# Patient Record
Sex: Female | Born: 1970 | Race: Black or African American | Hispanic: No | Marital: Single
Health system: Midwestern US, Academic
[De-identification: ages and names within clinical notes are randomized; demographics above are authoritative.]

---

## 2016-09-22 MED ORDER — LACTATED RINGERS IV SOLP
1000 mL | INTRAVENOUS | 0 refills | Status: CP
Start: 2016-09-22 — End: ?

## 2016-09-22 MED ORDER — ACETAMINOPHEN 325 MG PO TAB
650 mg | Freq: Once | ORAL | 0 refills | Status: CP
Start: 2016-09-22 — End: ?

## 2019-06-30 ENCOUNTER — Emergency Department: Admit: 2019-06-30 | Discharge: 2019-07-01 | Payer: Private Health Insurance - Indemnity

## 2019-06-30 ENCOUNTER — Encounter: Admit: 2019-06-30 | Discharge: 2019-06-30 | Payer: Private Health Insurance - Indemnity

## 2019-06-30 ENCOUNTER — Emergency Department: Admit: 2019-06-30 | Discharge: 2019-06-30 | Payer: Private Health Insurance - Indemnity

## 2019-06-30 DIAGNOSIS — R079 Chest pain, unspecified: Secondary | ICD-10-CM

## 2019-06-30 DIAGNOSIS — Z9109 Other allergy status, other than to drugs and biological substances: Secondary | ICD-10-CM

## 2019-06-30 LAB — POC TROPONIN: Lab: 0 ng/mL (ref 0.00–0.05)

## 2019-06-30 LAB — CBC AND DIFF
Lab: 0 10*3/uL (ref 0–0.20)
Lab: 17 (ref <20.7)
Lab: 4.4 M/UL (ref 4.0–5.0)
Lab: 4.9 10*3/uL (ref 4.5–11.0)

## 2019-06-30 NOTE — ED Notes
Pt arrived to ED 14 with c/o CP, HTN. Pt reports around 1400 she started having L chest pain that radiated to L shoulder and down L arm. Pt took BP at home and it was reading high, pt called nurse and was told to come to ED. Pt reports pain is constant, rated 9.5/10, worse with movement. Pt is A&Ox4, breathing appears NL, pt resting on cart. Pt connected to monitor, call light within reach.    Belongings: cell phone, purse, debit card, shirt, pants, socks, shoes

## 2019-07-01 LAB — COMPREHENSIVE METABOLIC PANEL
Lab: 0.2 mg/dL — ABNORMAL LOW (ref 0.3–1.2)
Lab: 0.8 mg/dL (ref 0.4–1.00)
Lab: 10 mg/dL (ref 7–25)
Lab: 105 MMOL/L (ref 98–110)
Lab: 12 U/L (ref 7–56)
Lab: 140 MMOL/L (ref 137–147)
Lab: 17 U/L — ABNORMAL HIGH (ref 7–40)
Lab: 28 MMOL/L (ref 21–30)
Lab: 3.7 MMOL/L (ref 3.5–5.1)
Lab: 3.9 g/dL (ref 3.5–5.0)
Lab: 58 U/L (ref 25–110)
Lab: 7 10*3/uL (ref 3–12)
Lab: 8 g/dL (ref 6.0–8.0)
Lab: 9.6 mg/dL (ref 8.5–10.6)
Lab: 93 mg/dL (ref 70–100)
Lab: >60 mL/min (ref >60)
Lab: >60 mL/min — ABNORMAL HIGH (ref >60)

## 2019-07-01 LAB — POC TROPONIN: Lab: 0 ng/mL (ref 0.00–0.05)

## 2019-07-01 NOTE — ED Notes
This RN provides discharge teaching and instructions. Pt AAOx4, verbalizes understanding and denies further questions. Pt ambulates off unit with an even, steady gait in no acute distress. All belongings and paperwork in patient possession upon departure.

## 2019-07-07 ENCOUNTER — Ambulatory Visit: Admit: 2019-07-07 | Discharge: 2019-07-07 | Payer: Private Health Insurance - Indemnity

## 2019-07-07 ENCOUNTER — Encounter: Admit: 2019-07-07 | Discharge: 2019-07-07 | Payer: Private Health Insurance - Indemnity

## 2019-07-07 DIAGNOSIS — Z9109 Other allergy status, other than to drugs and biological substances: Secondary | ICD-10-CM

## 2019-07-07 DIAGNOSIS — I10 Essential (primary) hypertension: Secondary | ICD-10-CM

## 2019-07-07 DIAGNOSIS — R0789 Other chest pain: Secondary | ICD-10-CM

## 2019-07-07 NOTE — Patient Instructions
- It was nice to see you today!    - Please start checking your blood pressure daily and make of your readings. Bring your blood pressure machine with you on follow up. We would like to keep your blood pressure 130/80 or less.     - I have ordered an echocardiogram to evaluate for any changes in your heart related to high blood pressure.     - Please have a lipid panel drawn with any other labs requested by the family medicine team later this month.     - I have requested a 1 month follow up visit. Please send [place] a MyChart message or call our nurse line at [phone] with any questions or concerns in the meantime.       Controlling High Blood Pressure  High blood pressure (hypertension) is often called the silent killer. This is because many people who have it, don?t know it. It can be very dangerous. High blood pressure can raise your risk of heart attack, stroke, heart disease, and heart failure. Controlling your blood pressure can decrease your risk of these problems. It's important to know the appropriate blood pressure range and remember to check your blood pressure regularly. Doing so can save your life.  Blood pressure measurements are given as 2 numbers. Systolic blood pressure is the upper number. This is the pressure when the heart contracts. Diastolic blood pressure is the lower number. This is the pressure when the heart relaxes between beats.  Blood pressure is categorized as normal, elevated, or stage 1 or stage 2 high blood pressure:  ? Normal blood pressure is systolic of less than 120 and diastolic of less than 80 (120/80)  ? Elevated blood pressure is systolic of 120 to 129 and diastolic less than 80  ? Stage 1 high blood pressure is systolic of 130 to 139 or diastolic between 80 to 89  ? Stage 2 high blood pressure is when systolic is 140 or higher or the diastolic is 90 or higher A heart-healthy lifestyle can help you control your blood pressure without medicines. Here are some things you can do to pursue a heart-healthy lifestyle:    Choose heart-healthy foods  ? Select low-salt, low-fat foods. Limit sodium intake to 2,400 mg per day or the amount suggested by your healthcare provider.  ? Limit canned, dried, cured, packaged, and fast foods. These can contain a lot of salt.  ? Eat 8 to 10 servings of fruits and vegetables every day.  ? Choose lean meats, fish, or chicken.  ? Eat whole-grain pasta, brown rice, and beans.  ? Eat 2 to 3 servings of low-fat or fat-free dairy products.  ? Ask your doctor about the DASH eating plan. This plan helps reduce blood pressure.  ? When you go to a restaurant, ask that your meal be prepared with no added salt.    Stay at a healthy weight  ? Ask your healthcare provider how many calories to eat a day. Then stick to that number.  ? Ask your healthcare provider what weight range is healthiest for you. If you are overweight, a weight loss of only 3% to 5% of your body weight?can help lower blood pressure. Generally, a good weight loss goal is to lose 10% of your body weight in a year.  ? Limit snacks and sweets.  ? Get regular exercise.    Get up and get active  ? Find activities you enjoy that can be done alone or with  friends or family. Such activities might include bicycling, dancing, walking, or jogging.  ? Park farther away from building entrances to walk more.  ? Use stairs instead of the elevator.  ? When you can, walk or bike instead of driving.  ? Rake leaves, garden, or do household repairs.  ? Be active at a moderate to vigorous level of physical activity for at least 40 minutes for a minimum of 3 to 4 days a week.?    Manage stress  ? Make time to relax and enjoy life. Find time to laugh.  ? Communicate your concerns with your loved ones and your healthcare provider.  ? Visit with family and friends, and keep up with hobbies. Limit alcohol and quit smoking  ? Men should have no more than 2 drinks per day.  ? Women should have no more than 1 drink per day.  ? Talk with your healthcare provider about quitting smoking. Smoking significantly increases your risk for heart disease and stroke. Ask your healthcare provider about community smoking cessation programs and other options.    Medicines  If lifestyle changes aren?t enough, your healthcare provider may prescribe high blood pressure medicine. Take all medicines as prescribed. If you have any questions about your medicines, ask your healthcare provider before stopping or changing them.  StayWell last reviewed this educational content on 11/03/2017  ? 2000-2020 [employer], [place]. All rights reserved. This information is not intended as a substitute for professional medical care. Always follow your healthcare professional's instructions.

## 2019-07-08 ENCOUNTER — Encounter: Admit: 2019-07-08 | Discharge: 2019-07-08 | Payer: Private Health Insurance - Indemnity

## 2019-07-08 DIAGNOSIS — Z9109 Other allergy status, other than to drugs and biological substances: Secondary | ICD-10-CM

## 2019-07-11 ENCOUNTER — Encounter: Admit: 2019-07-11 | Discharge: 2019-07-11 | Payer: Private Health Insurance - Indemnity

## 2019-07-11 NOTE — Telephone Encounter
Pt. requesting a later appointment time on the date she is scheduled. LVM [id number]

## 2019-07-11 NOTE — Telephone Encounter
Returned pts call to assist with scheduling needs no answer left vm. First attempt

## 2019-07-15 ENCOUNTER — Ambulatory Visit: Admit: 2019-07-15 | Discharge: 2019-07-15 | Payer: Private Health Insurance - Indemnity

## 2019-07-15 ENCOUNTER — Encounter: Admit: 2019-07-15 | Discharge: 2019-07-15 | Payer: Private Health Insurance - Indemnity

## 2019-07-15 DIAGNOSIS — I10 Essential (primary) hypertension: Secondary | ICD-10-CM

## 2019-07-15 DIAGNOSIS — R0789 Other chest pain: Secondary | ICD-10-CM

## 2019-08-05 ENCOUNTER — Encounter: Admit: 2019-08-05 | Discharge: 2019-08-05 | Payer: Private Health Insurance - Indemnity

## 2019-08-14 ENCOUNTER — Encounter
Admit: 2019-08-14 | Discharge: 2019-08-14 | Payer: Private Health Insurance - Indemnity | Primary: Student in an Organized Health Care Education/Training Program

## 2019-08-14 ENCOUNTER — Ambulatory Visit
Admit: 2019-08-14 | Discharge: 2019-08-15 | Payer: Private Health Insurance - Indemnity | Primary: Student in an Organized Health Care Education/Training Program

## 2019-08-14 MED ORDER — LISINOPRIL 10 MG PO TAB
10 mg | ORAL_TABLET | Freq: Every day | ORAL | 3 refills | Status: DC
Start: 2019-08-14 — End: 2019-08-28

## 2019-08-14 NOTE — Patient Instructions
Low-Salt Diet  This diet removes foods that are high in salt. It also limits the amount of salt you use when cooking. It is most often used for people with high blood pressure, fluid retention (edema), and kidney, liver, or heart disease.   Table salt has the mineral sodium. Your body needs sodium to work normally. But too much sodium can make your health problems worse. Your healthcare provider advises a low-salt (low-sodium) diet for you. Your total daily allowed amount of salt is 1,500 to 2,300 milligrams (mg). This is less than 1 teaspoon of table salt. This means you can have only about 500 to 700 mg of sodium at each meal. People with certain health problems should limit salt intake to the lower end of the advised range.     When you cook, don’t add much salt. If you can cook without using salt, even better. Don’t add salt to your food at the table.   When shopping, read food labels. Salt is often called sodium on the label. Choose foods that are salt-free, low salt, or very low salt. Note that foods with reduced salt may not lower your salt intake enough.     Beans, potatoes, and pasta  OK: Dry beans, split peas, lentils, potatoes, rice, macaroni, pasta, spaghetti with no added salt, canned beans with no added salt   Avoid: Salted potato chips; regular (salt added) canned beans   Breads and grains  OK: Low-sodium breads, rolls, cereals, and cakes; low-salt crackers, matzo crackers   Avoid: Salted crackers, pretzels, tortilla chips, popcorn, and other salty snacks; French toast, pancakes, muffins, regular bread   Dairy  OK: Milk, chocolate milk, hot chocolate mix, low-salt cheeses, yogurt   Avoid: Processed cheese and cheese spreads; Roquefort, Camembert, and cottage cheese; buttermilk, instant breakfast drink   Desserts  OK: Ice cream, frozen yogurt, juice bars, gelatin, cookies and pies, sugar, honey, jelly, hard candy   Avoid: Most pies, cakes and cookies made with salt; instant pudding   Drinks  OK: Tea,  coffee, fizzy (carbonated) drinks, juices   Avoid: Flavored coffees, electrolyte replacement drinks, sports drinks   Meats  OK: All fresh meat, fish, poultry, low-salt tuna, eggs, egg substitute   Avoid: Smoked, pickled, brine-cured, or salted meats and fish, and processed poultry injected with salt or marinade. This includes bacon, chipped beef, corned beef, hot dogs, deli meats, ham, kosher meats, salt pork, sausage, canned tuna, salted codfish, smoked salmon, herring, sardines, and anchovies.   Seasonings  OK: Most seasonings are okay. Good substitutes for salt include: fresh herb blends, hot sauce, lemon, garlic, curry, vinegar, dry mustard, parsley, cilantro, horseradish, tomato paste, margarine, mayonnaise, unsalted butter, cream cheese, vegetable and olive oil, cream, low-salt salad dressing and gravy.   Avoid: Regular ketchup, relishes, pickles, soy sauce, teriyaki sauce, Worcestershire sauce, BBQ sauce, tartar sauce, meat tenderizer, chili sauce, regular gravy, regular salad dressing, salted butter   Soups  OK: Low-salt soups and broths made with allowed foods   Avoid: Bouillon cubes, soups with smoked or salted meats, regular soup and broth   Vegetables  OK: Most vegetables are okay, including canned vegetables with no salt added, and plain frozen vegetables; low-salt tomato and vegetable juices   Avoid: Sauerkraut and other brined vegetables; pickles and pickled vegetables; tomato juice, olives, canned vegetables with salt, frozen vegetables with sauces   StayWell last reviewed this educational content on 07/06/2018  © 2000-2020 [employer]. All rights reserved. This information is not intended as a substitute for professional medical care. Always follow   your healthcare professional's instructions.

## 2019-08-27 ENCOUNTER — Encounter
Admit: 2019-08-27 | Discharge: 2019-08-27 | Payer: Private Health Insurance - Indemnity | Primary: Student in an Organized Health Care Education/Training Program

## 2019-08-27 ENCOUNTER — Ambulatory Visit
Admit: 2019-08-27 | Discharge: 2019-08-27 | Payer: Private Health Insurance - Indemnity | Primary: Student in an Organized Health Care Education/Training Program

## 2019-08-27 DIAGNOSIS — Z1239 Encounter for other screening for malignant neoplasm of breast: Secondary | ICD-10-CM

## 2019-08-27 NOTE — Progress Notes
Progress Note - Outpatient    Encounter Date: 08/28/2019   Patient Name: Amy Webb   Date of Birth: 02-Mar-1971   MRN: [id number]   Encounter Provider Jake Bathe, MD   Primary Care Physician:  Madelyn Flavors, MD (General)       Subjective     Chief Complaint: annual physical and blood pressure follow up  Chief Complaint   Patient presents with   ? Physical     History of Present Illness  Amy Webb is a 50 y.o. female with PMH hypertension, gestational diabetes, LSIL who presents for annual physical and blood pressure follow up.    HTN: patient reports that she takes her blood pressure at home and it has been between 136-137 systolic and 93-97 diastolic. Patient is very hesitant to increase the lisinopril because she doesn't want to have more medication in her body.     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 Drank wine on birthday.   - Tobacco: never  - Illicit drugs: none  - Safe at home: yes    Reproductive health:  - Last Pap smear: 2019. Per patient, normal. LSIL and HPV positive.   - LMP: 08/11/19  - Periods are regular  - Sexual activity: yes  - Partners are female, female, or both: female  - Condom use: every time  - Contraception: tubal ligation  - GP: G2P2  - Hx of STIs: none    Family history:  - Breast cancer: mother at age 70  - Colon cancer: mother had polyps  - Premature CAD: none    Screening:  - Mammogram: completed yesterday  - Colonoscopy: 2013, normal    Mood:  - PHQ-2: 0    Labs:  - Lipid panel: due  - CMP: 06/30/19  - One-time HIV screening: completed  - One-time HCV screening: completed  - Chlamydia, gonorrhea: screening     Medications:  - Statin (LDL >190, DM 40-75 LDL 70+, ASCVD >7.5%): will get lipid panel    Immunizations:  - Tdap: due in 2024  - PPSV23: not indicated    Review of Systems   Constitutional: Negative for chills and fever.   HENT: Negative for hearing loss. Eyes: Negative for blurred vision.   Respiratory: Negative for shortness of breath.    Cardiovascular: Negative for chest pain.   Gastrointestinal: Negative for constipation and diarrhea.   Genitourinary: Negative for dysuria.   Skin: Negative for rash.   Psychiatric/Behavioral: Negative for depression.     Past Medical History:   Medical History:   Diagnosis Date   ? Allergy    ? Environmental allergies        Surgical History:  Surgical History:   Procedure Laterality Date   ? HX HAND SURGERY  2005    Right hand   ? FRACTURE SURGERY     ? HX TUBAL LIGATION         Social History:   Social History     Socioeconomic History   ? Marital status: Single     Spouse name: Not given   ? Number of children: 1   ? Years of education: Not on file   ? Highest education level: Not on file   Occupational History   ? Occupation: Chief Financial Officer: [employer]   Tobacco Use   ? Smoking status: Never  Smoker   ? Smokeless tobacco: Never Used   Substance and Sexual Activity   ? Alcohol use: Not Currently     Alcohol/week: 0.0 standard drinks   ? Drug use: Never   ? Sexual activity: Yes     Partners: Male     Birth control/protection: Surgical   Other Topics Concern   ? Not on file   Social History Narrative   ? Not on file       Family History:   Family History   Problem Relation Age of Onset   ? Cancer-Breast Mother    ? Diabetes Mother    ? Hypertension Mother    ? Arthritis Mother    ? Asthma Mother    ? Hearing Loss Mother    ? Hypertension Father    ? Arthritis Father    ? Other Brother         Parkinson's   ? Depression Brother         Pt stated he's suicidal; receiving help. Pt unsure of ailment, though.   ? Drug Abuse Brother    ? Mental Illness Brother    ? Cervical Cancer Neg Hx    ? Cancer-Colon Neg Hx    ? Cancer-Ovarian Neg Hx    ? Cancer-Uterine Neg Hx    ? Anesthetic Complication Neg Hx    ? Bleeding Disorders Neg Hx    ? Heart Disease Neg Hx          Objective     Medications  ? lisinopriL (ZESTRIL) 20 mg tablet Take one tablet by mouth daily.   ? mv,calcium,min/iron/folic/vitK (ONE-A-DAY WOMEN'S COMPLETE PO) Take  by mouth.       Allergies:   Allergies   Allergen Reactions   ? Aspirin HIVES   ? Pcn [Penicillins] HIVES   ? Sudafed [Pseudoephedrine Hcl] HIVES and DIZZINESS       Vitals  Vitals:    08/28/19 1701 08/28/19 1703   BP: (!) 150/96 (!) 141/98   Pulse: 79    Resp: 14    Temp: 37 ?C (98.6 ?F)    TempSrc: Skin    Weight: 89.4 kg (197 lb)    Height: 172.7 cm (67.99)    PainSc: Zero        Body mass index is 29.96 kg/m?Marland Kitchen      Physical Exam   Constitutional: She is well-developed, well-nourished, and in no distress.   HENT:   Head: Normocephalic and atraumatic.   Eyes: Conjunctivae are normal. No scleral icterus.   Neck: Neck supple.   Cardiovascular: Normal rate and regular rhythm.   Pulmonary/Chest: Effort normal. She has no wheezes. She has no rales.   Abdominal: Soft. There is no abdominal tenderness. There is no rebound.   Genitourinary:    Genitourinary Comments: Moderate thin white discharge from cervix. No cervical motion tenderness. No adnexal masses.     Musculoskeletal:         General: No edema.   Lymphadenopathy:     She has no cervical adenopathy.   Neurological: She is alert.   Answering questions appropriately.   Skin: Skin is warm and dry.   Psychiatric:   Cooperative. Appropriate affect.         Assessment/Plan     Amy Webb was seen today for physical.    Diagnoses and all orders for this visit:    Annual physical exam  -     Amy Webb is a 49 year old female  with PMH of HTN, gestational diabetes, and previous LSIL on Pap  -     Performed Pap today. Moderate white thin cervical discharge. Will order gonorrhea, chlamydia, and trichomonas testing  -     Completed mammogram yesterday  -     Patient has BMI of 30. Will order lipid profile and HbA1c  -     LIPID PROFILE; Future; Expected date: 08/28/2019  -     CYTOLOGY PAP SMEAR SCREENING  -     TRICHOMONAS VAGINALIS THIN PREP; Future; Expected date: 08/28/2019  -     CHLAM/NG PCR THIN PREP; Future; Expected date: 08/28/2019  -     HEMOGLOBIN A1C; Future; Expected date: 08/28/2019    Essential hypertension  -     Blood pressure not well controlled with systolic >140 and diastolic >90. Will increase lisinopril to 20mg  daily.  -     Recommended patient record blood pressure values daily at home to bring to next appointment. Will reassess in 4 weeks.   -     lisinopriL (ZESTRIL) 20 mg tablet; Take one tablet by mouth daily.    Disposition: Return in about 4 weeks (around 09/25/2019) for Hypertension Follow-up.    Patient seen by and discussed with attending physician, Dr. Katrinka Blazing.    Jake Bathe, MD  [facility]  Department of Family Medicine, PGY-1

## 2019-08-28 ENCOUNTER — Encounter
Admit: 2019-08-28 | Discharge: 2019-08-28 | Payer: Private Health Insurance - Indemnity | Primary: Student in an Organized Health Care Education/Training Program

## 2019-08-28 ENCOUNTER — Ambulatory Visit
Admit: 2019-08-28 | Discharge: 2019-08-28 | Payer: Private Health Insurance - Indemnity | Primary: Student in an Organized Health Care Education/Training Program

## 2019-08-28 DIAGNOSIS — I10 Essential (primary) hypertension: Secondary | ICD-10-CM

## 2019-08-28 DIAGNOSIS — Z0000 Encounter for general adult medical examination without abnormal findings: Secondary | ICD-10-CM

## 2019-08-28 DIAGNOSIS — Z9109 Other allergy status, other than to drugs and biological substances: Secondary | ICD-10-CM

## 2019-08-28 DIAGNOSIS — T7840XA Allergy, unspecified, initial encounter: Secondary | ICD-10-CM

## 2019-08-28 MED ORDER — LISINOPRIL 20 MG PO TAB
20 mg | ORAL_TABLET | Freq: Every day | ORAL | 0 refills | Status: DC
Start: 2019-08-28 — End: 2019-10-20

## 2019-08-29 ENCOUNTER — Encounter
Admit: 2019-08-29 | Discharge: 2019-08-29 | Payer: Private Health Insurance - Indemnity | Primary: Student in an Organized Health Care Education/Training Program

## 2019-08-29 NOTE — Patient Instructions
Established High Blood Pressure    High blood pressure (hypertension) is a long-term (chronic) disease. Often healthcare providers don’t know what causes it. But it can be caused by certain health conditions and medicines.  If you have high blood pressure, you may not have any symptoms. If you do have symptoms, they may include:  · Headache  · Dizziness  · Changes in your vision  · Chest pain  · Shortness of breath  But even without symptoms, high blood pressure that’s not treated raises your risk for heart attack, heart failure, kidney disease, and stroke. High blood pressure is a serious health risk and shouldn’t be ignored.  Blood pressure measurements are given as 2 numbers. Systolic blood pressure is the upper number. This is the pressure when the heart contracts. Diastolic blood pressure is the lower number. This is the pressure when the heart relaxes between beats. You will see your blood pressure readings written together. For example, a person with a systolic pressure of 118 and a diastolic pressure of 78 will have 118/78 written in the medical record.  Blood pressure is classified as normal, raised (elevated) or stage 1 or stage 2 high blood pressure:  · Normal blood pressure. Systolic of less than 120 and diastolic of less than 80 (120/80).  · Elevated blood pressure. Systolic of 120 to 129 and diastolic less than 80.  · Stage 1 high blood pressure. Systolic is 130 to 139 or diastolic between 80 to 89.  · Stage 2 high blood pressure. Systolic is 140 or higher or the diastolic is 90 or higher.  Home care  If you have high blood pressure, follow these home care guidelines to help lower your blood pressure. If you are taking medicines for high blood pressure, these methods may reduce or end your need for medicines in the future.  · Start a weight-loss program if you are overweight.  · Cut back on how much salt you get in your diet. Here’s how to do this:  ? Don’t eat foods that have a lot of salt. These  include olives, pickles, smoked meats, and salted potato chips.  ? Don’t add salt to your food at the table.  ? Use only small amounts of salt when cooking.  · Start an exercise program. Talk with your healthcare provider about the type of exercise program that would be best for you. It doesn't have to be hard. Even brisk walking for 20 minutes 3 times a week is a good form of exercise.  · Don’t take medicines that stimulate the heart. This includes many over-the-counter cold and sinus decongestant pills and sprays, as well as diet pills. Check the warnings about high blood pressure on the label. Before buying any over-the-counter medicines or supplements, always ask the pharmacist about the product's possible interaction with your high blood pressure and your high blood pressure medicines.  · Stimulants such as amphetamine or cocaine could be deadly for someone with high blood pressure. Never take these.  · Limit how much caffeine you get in your diet. Switch to caffeine-free products.  · Stop smoking. If you are a long-time smoker, this can be hard. Talk with your healthcare provider about medicines and nicotine replacement options to help you. Also join a stop-smoking program . This makes it more likely that you will quit for good.  · Learn how to handle stress. This is an important part of any program to lower blood pressure. Learn about relaxation methods such as meditation,   yoga, or biofeedback.  · If your provider prescribed medicines, take them exactly as directed. Missing doses may cause your blood pressure get out of control.  · If you miss a dose, check with your healthcare provider or pharmacist about what to do.  · Think about buying an automatic blood pressure machine to check your blood pressure at home. Ask your provider for a recommendation. You can get one of these at most pharmacies.  Using a home blood pressure monitor  The American Heart Association advises the following guidelines for home  blood pressure monitoring:  · Don't smoke or drink coffee for 30 minutes before taking your blood pressure.  · Go to the bathroom before the test.  · Relax for 5 minutes before taking the measurement.  · Sit with your back supported (don't sit on a couch or soft chair). Keep your feet on the floor uncrossed. Place your arm on a solid flat surface (such as a table) with the upper part of the arm at heart level. Place the middle of the cuff directly above the bend of the elbow. Check the monitor's instruction manual for an illustration.  · Take multiple readings. When you measure, take 2 to 3 readings one minute apart and record all of the results.  · Take your blood pressure at the same time every day, or as your healthcare provider advises.  · Record the date, time, and blood pressure reading.  · Take the record with you to your next healthcare appointment. If your blood pressure monitor has a built-in memory, just take the monitor with you to your next appointment.  · Call your provider if you have several high readings. Don't be frightened by one high blood pressure reading. But if you get a few high readings, check in with your healthcare provider.  Follow-up care  You will need to see your healthcare provider regularly. This is to check your blood pressure and to make changes to your medicines. Make a follow-up appointment as directed. Bring the record of your home blood pressure readings to the appointment.  Call 911  Call 911 if you have any of these:  · Blood pressure of 180/120 or higher  · Chest pain or shortness of breath  · Weakness of an arm or leg or one side of the face  · Problems speaking or seeing     When to get medical advice  Call your healthcare provider right away if any of these occur:  · Severe headache  · Throbbing or rushing sound in the ears  · Nosebleed  · Sudden severe pain in your belly (abdomen)  · Extreme drowsiness, confusion, or fainting  · Dizziness or spinning feeling  (vertigo)  StayWell last reviewed this educational content on 12/03/2017  © 2000-2020 [employer]. All rights reserved. This information is not intended as a substitute for professional medical care. Always follow your healthcare professional's instructions.

## 2019-09-12 ENCOUNTER — Encounter
Admit: 2019-09-12 | Discharge: 2019-09-12 | Payer: Private Health Insurance - Indemnity | Primary: Student in an Organized Health Care Education/Training Program

## 2019-09-12 NOTE — Telephone Encounter
Labs already ordered at previous OV. Will sent pt message now.

## 2019-09-12 NOTE — Telephone Encounter
Pt. requesting her HBA1c be ordered as she is over due. LVM 1001

## 2019-10-06 ENCOUNTER — Ambulatory Visit
Admit: 2019-10-06 | Discharge: 2019-10-06 | Payer: Private Health Insurance - Indemnity | Primary: Student in an Organized Health Care Education/Training Program

## 2019-10-06 ENCOUNTER — Encounter
Admit: 2019-10-06 | Discharge: 2019-10-06 | Payer: Private Health Insurance - Indemnity | Primary: Student in an Organized Health Care Education/Training Program

## 2019-10-06 DIAGNOSIS — E6609 Other obesity due to excess calories: Secondary | ICD-10-CM

## 2019-10-06 DIAGNOSIS — R0789 Other chest pain: Secondary | ICD-10-CM

## 2019-10-06 DIAGNOSIS — Z9109 Other allergy status, other than to drugs and biological substances: Secondary | ICD-10-CM

## 2019-10-06 DIAGNOSIS — I10 Essential (primary) hypertension: Secondary | ICD-10-CM

## 2019-10-06 DIAGNOSIS — Z0000 Encounter for general adult medical examination without abnormal findings: Secondary | ICD-10-CM

## 2019-10-06 DIAGNOSIS — T7840XA Allergy, unspecified, initial encounter: Secondary | ICD-10-CM

## 2019-10-06 LAB — LIPID PROFILE
Lab: 161 mg/dL — ABNORMAL HIGH (ref <100)
Lab: 185 mg/dL
Lab: 218 mg/dL — ABNORMAL HIGH (ref <200)
Lab: 225 mg/dL — ABNORMAL HIGH (ref <150)
Lab: 33 mg/dL — ABNORMAL LOW (ref >40)
Lab: 45 mg/dL

## 2019-10-06 LAB — BASIC METABOLIC PANEL
Lab: 0.7 mg/dL (ref 0.4–1.00)
Lab: 10 mg/dL (ref 7–25)
Lab: 100 mg/dL (ref 70–100)
Lab: 105 MMOL/L (ref 98–110)
Lab: 139 MMOL/L (ref 137–147)
Lab: 27 MMOL/L (ref 21–30)
Lab: 3.7 MMOL/L (ref 3.5–5.1)
Lab: 9.1 mg/dL (ref 8.5–10.6)
Lab: >60 mL/min (ref >60)
Lab: >60 mL/min (ref >60)
Lab: 7 (ref 3–12)

## 2019-10-06 NOTE — Progress Notes
Subjective: Amy Webb is a 49 y.o. female presenting for HTN    History of Present Illness:    HTN  - previously elevated BP on Lisinopril 10mg   - Increased to 20mg  on 3/25  - 133/86 today  - tolerating dose increase without side effects    Review of Systems:  Remainder of review of systems negative except for as mentioned in HPI.     Active Ambulatory Problems     Diagnosis Date Noted   ? HTN (hypertension) 04/08/2014   ? History of abnormal cervical Pap smear 04/08/2014   ? Other chest pain 07/07/2019     Resolved Ambulatory Problems     Diagnosis Date Noted   ? Supervision of high-risk pregnancy 10/02/2012   ? Gestational hypertension 01/15/2013   ? GDM (gestational diabetes mellitus), class A1 01/20/2013   ? AMA (advanced maternal age) multigravida 35+ 01/20/2013   ? Pregnancy 03/23/2013     Past Medical History:   Diagnosis Date   ? Allergy    ? Environmental allergies        Surgical History:   Procedure Laterality Date   ? HX HAND SURGERY  2005    Right hand   ? FRACTURE SURGERY     ? HX TUBAL LIGATION         Current Outpatient Medications on File Prior to Visit   Medication Sig Dispense Refill   ? lisinopriL (ZESTRIL) 20 mg tablet Take one tablet by mouth daily. 30 tablet 0   ? mv,calcium,min/iron/folic/vitK (ONE-A-DAY WOMEN'S COMPLETE PO) Take  by mouth.       No current facility-administered medications on file prior to visit.        Social History     Socioeconomic History   ? Marital status: Single     Spouse name: Not given   ? Number of children: 1   ? Years of education: Not on file   ? Highest education level: Not on file   Occupational History   ? Occupation: Chief Financial Officer: [employer]   Tobacco Use   ? Smoking status: Never Smoker   ? Smokeless tobacco: Never Used   Substance and Sexual Activity   ? Alcohol use: Not Currently     Alcohol/week: 0.0 standard drinks   ? Drug use: Never   ? Sexual activity: Yes     Partners: Male     Birth control/protection: Surgical   Other Topics Concern   ? Not on file   Social History Narrative   ? Not on file       Family History   Problem Relation Age of Onset   ? Cancer-Breast Mother    ? Diabetes Mother    ? Hypertension Mother    ? Arthritis Mother    ? Asthma Mother    ? Hearing Loss Mother    ? Hypertension Father    ? Arthritis Father    ? Other Brother         Parkinson's   ? Depression Brother         Pt stated he's suicidal; receiving help. Pt unsure of ailment, though.   ? Drug Abuse Brother    ? Mental Illness Brother    ? Cervical Cancer Neg Hx    ? Cancer-Colon Neg Hx    ? Cancer-Ovarian Neg Hx    ? Cancer-Uterine Neg Hx    ? Anesthetic Complication Neg Hx    ? Bleeding Disorders  Neg Hx    ? Heart Disease Neg Hx        Objective:    Vitals:    10/06/19 1359   BP: 133/86   Pulse: 74   Resp: 16   Temp: 36.3 ?C (97.4 ?F)   TempSrc: Skin   Weight: 88 kg (194 lb)   Height: 172.7 cm (68)   PainSc: Zero       Body mass index is 29.5 kg/m?Marland Kitchen    General: No acute distress, non-labored breathing. AAOx3. Afebrile  Head: Normocephalic and atraumatic.    Eyes: Pupils are equal. No scleral icterus.    Cardiovascular: Regular Rate, No edema, regular rhythm, no murmur  Pulmonary/Chest: Breathing is non labored, not in respiratory distress. Lungs clear to auscultation bilaterally  Abdominal: nondistended  Musculoskeletal: No deformity.    Skin: No rash or wound on exposed areas   Neuro: Grossly intact.  Psychiatric: Normal mood and affect.   Nursing note and vitals reviewed.      Assessment/Plan:  Amy Webb was seen today for hypertension.    Diagnoses and all orders for this visit:    Essential hypertension  -     BP at goal with current dose of lisinopril and dose tolerated well. Will continue as prescribed. Needs BMP.     Return precautions discussed, questions sought and answered. Patient expressed understanding and agreed with treatment plan.     Patient staffed with Dr. Derald Macleod T. Primitivo Gauze, M.D.  Resident Physician, PGY-3  Department of Family Medicine

## 2019-10-19 ENCOUNTER — Encounter
Admit: 2019-10-19 | Discharge: 2019-10-19 | Payer: Private Health Insurance - Indemnity | Primary: Student in an Organized Health Care Education/Training Program

## 2019-10-19 DIAGNOSIS — I10 Essential (primary) hypertension: Secondary | ICD-10-CM

## 2019-10-20 MED ORDER — LISINOPRIL 20 MG PO TAB
ORAL_TABLET | Freq: Every day | 1 refills | Status: DC
Start: 2019-10-20 — End: 2019-12-10

## 2019-12-09 ENCOUNTER — Encounter
Admit: 2019-12-09 | Discharge: 2019-12-09 | Payer: Private Health Insurance - Indemnity | Primary: Student in an Organized Health Care Education/Training Program

## 2019-12-09 DIAGNOSIS — I10 Essential (primary) hypertension: Secondary | ICD-10-CM

## 2019-12-09 MED ORDER — LISINOPRIL 20 MG PO TAB
20 mg | ORAL_TABLET | Freq: Every day | ORAL | 1 refills | Status: AC
Start: 2019-12-09 — End: ?

## 2019-12-25 ENCOUNTER — Encounter
Admit: 2019-12-25 | Discharge: 2019-12-25 | Payer: Private Health Insurance - Indemnity | Primary: Student in an Organized Health Care Education/Training Program

## 2020-08-04 ENCOUNTER — Encounter
Admit: 2020-08-04 | Discharge: 2020-08-04 | Payer: Private Health Insurance - Indemnity | Primary: Student in an Organized Health Care Education/Training Program

## 2020-08-04 DIAGNOSIS — Z1231 Encounter for screening mammogram for malignant neoplasm of breast: Secondary | ICD-10-CM

## 2020-08-16 ENCOUNTER — Encounter
Admit: 2020-08-16 | Discharge: 2020-08-16 | Payer: Private Health Insurance - Indemnity | Primary: Student in an Organized Health Care Education/Training Program

## 2020-08-17 ENCOUNTER — Encounter
Admit: 2020-08-17 | Discharge: 2020-08-17 | Payer: Private Health Insurance - Indemnity | Primary: Student in an Organized Health Care Education/Training Program

## 2020-08-17 ENCOUNTER — Ambulatory Visit
Admit: 2020-08-17 | Discharge: 2020-08-17 | Payer: Private Health Insurance - Indemnity | Primary: Student in an Organized Health Care Education/Training Program

## 2020-08-17 DIAGNOSIS — Z1231 Encounter for screening mammogram for malignant neoplasm of breast: Secondary | ICD-10-CM

## 2020-08-20 ENCOUNTER — Encounter
Admit: 2020-08-20 | Discharge: 2020-08-20 | Payer: Private Health Insurance - Indemnity | Primary: Student in an Organized Health Care Education/Training Program

## 2020-08-20 DIAGNOSIS — I10 Essential (primary) hypertension: Secondary | ICD-10-CM

## 2020-08-20 MED ORDER — LISINOPRIL 20 MG PO TAB
20 mg | ORAL_TABLET | Freq: Every day | ORAL | 0 refills | Status: AC
Start: 2020-08-20 — End: ?

## 2020-09-14 ENCOUNTER — Encounter
Admit: 2020-09-14 | Discharge: 2020-09-14 | Payer: Private Health Insurance - Indemnity | Primary: Student in an Organized Health Care Education/Training Program

## 2020-09-21 ENCOUNTER — Encounter
Admit: 2020-09-21 | Discharge: 2020-09-21 | Payer: Private Health Insurance - Indemnity | Primary: Student in an Organized Health Care Education/Training Program

## 2020-09-21 ENCOUNTER — Ambulatory Visit
Admit: 2020-09-21 | Discharge: 2020-09-21 | Payer: Private Health Insurance - Indemnity | Primary: Student in an Organized Health Care Education/Training Program

## 2020-09-21 DIAGNOSIS — I10 Essential (primary) hypertension: Secondary | ICD-10-CM

## 2020-09-21 DIAGNOSIS — Z1211 Encounter for screening for malignant neoplasm of colon: Secondary | ICD-10-CM

## 2020-09-21 DIAGNOSIS — Z9109 Other allergy status, other than to drugs and biological substances: Secondary | ICD-10-CM

## 2020-09-21 DIAGNOSIS — T7840XA Allergy, unspecified, initial encounter: Secondary | ICD-10-CM

## 2020-09-21 LAB — LIPID PROFILE
Lab: 154 mg/dL — ABNORMAL HIGH (ref <150)
Lab: 162 mg/dL — ABNORMAL HIGH (ref <100)
Lab: 179 mg/dL (ref 0.4–1.00)
Lab: 214 mg/dL — ABNORMAL HIGH (ref <200)
Lab: 31 mg/dL (ref 7–25)
Lab: 35 mg/dL — ABNORMAL LOW (ref >40)

## 2020-09-21 LAB — BASIC METABOLIC PANEL
Lab: 141 MMOL/L (ref 137–147)
Lab: 4.1 MMOL/L (ref 3.5–5.1)
Lab: 9.1 mg/dL (ref 8.5–10.6)
Lab: >60 mL/min (ref >60)

## 2020-09-21 MED ORDER — LISINOPRIL 20 MG PO TAB
20 mg | ORAL_TABLET | Freq: Every day | ORAL | 3 refills | Status: AC
Start: 2020-09-21 — End: ?

## 2020-09-21 NOTE — Patient Instructions
Low-Salt Diet  This diet removes foods that are high in salt. It also limits the amount of salt you use when cooking. It is most often used for people with high blood pressure, fluid retention (edema), and kidney, liver, or heart disease.   Table salt has the mineral sodium. Your body needs sodium to work normally. But too much sodium can make your health problems worse. Your healthcare provider advises a low-salt (low-sodium) diet for you. Your total daily allowed amount of salt is 1,500 to 2,300 milligrams (mg). This is less than 1 teaspoon of table salt. This means you can have only about 500 to 700 mg of sodium at each meal. People with certain health problems should limit salt intake to the lower end of the advised range.     When you cook, don’t add much salt. If you can cook without using salt, even better. Don’t add salt to your food at the table.   When shopping, read food labels. Salt is often called sodium on the label. Choose foods that are salt-free, low salt, or very low salt. Note that foods with reduced salt may not lower your salt intake enough.     Beans, potatoes, and pasta  OK: Dry beans, split peas, lentils, potatoes, rice, macaroni, pasta, spaghetti with no added salt, canned beans with no added salt   Avoid: Salted potato chips; regular (salt added) canned beans   Breads and grains  OK: Low-sodium breads, rolls, cereals, and cakes; low-salt crackers, matzo crackers   Avoid: Salted crackers, pretzels, tortilla chips, popcorn, and other salty snacks; French toast, pancakes, muffins, regular bread   Dairy  OK: Milk, chocolate milk, hot chocolate mix, low-salt cheeses, yogurt   Avoid: Processed cheese and cheese spreads; Roquefort, Camembert, and cottage cheese; buttermilk, instant breakfast drink   Desserts  OK: Ice cream, frozen yogurt, juice bars, gelatin, cookies and pies, sugar, honey, jelly, hard candy   Avoid: Most pies, cakes and cookies made with salt; instant pudding   Drinks  OK: Tea,  coffee, fizzy (carbonated) drinks, juices   Avoid: Flavored coffees, electrolyte replacement drinks, sports drinks   Meats  OK: All fresh meat, fish, poultry, low-salt tuna, eggs, egg substitute   Avoid: Smoked, pickled, brine-cured, or salted meats and fish, and processed poultry injected with salt or marinade. This includes bacon, chipped beef, corned beef, hot dogs, deli meats, ham, kosher meats, salt pork, sausage, canned tuna, salted codfish, smoked salmon, herring, sardines, and anchovies.   Seasonings  OK: Most seasonings are okay. Good substitutes for salt include: fresh herb blends, hot sauce, lemon, garlic, curry, vinegar, dry mustard, parsley, cilantro, horseradish, tomato paste, margarine, mayonnaise, unsalted butter, cream cheese, vegetable and olive oil, cream, low-salt salad dressing and gravy.   Avoid: Regular ketchup, relishes, pickles, soy sauce, teriyaki sauce, Worcestershire sauce, BBQ sauce, tartar sauce, meat tenderizer, chili sauce, regular gravy, regular salad dressing, salted butter   Soups  OK: Low-salt soups and broths made with allowed foods   Avoid: Bouillon cubes, soups with smoked or salted meats, regular soup and broth   Vegetables  OK: Most vegetables are okay, including canned vegetables with no salt added, and plain frozen vegetables; low-salt tomato and vegetable juices   Avoid: Sauerkraut and other brined vegetables; pickles and pickled vegetables; tomato juice, olives, canned vegetables with salt, frozen vegetables with sauces   [employer] last reviewed this educational content on 07/06/2018  © 2000-2021 [employer]. All rights reserved. This information is not intended as a substitute for professional medical care. Always follow   your healthcare professional's instructions.

## 2020-09-22 ENCOUNTER — Ambulatory Visit
Admit: 2020-09-22 | Discharge: 2020-09-22 | Payer: Private Health Insurance - Indemnity | Primary: Student in an Organized Health Care Education/Training Program

## 2020-09-22 DIAGNOSIS — Z1211 Encounter for screening for malignant neoplasm of colon: Secondary | ICD-10-CM

## 2020-09-23 ENCOUNTER — Encounter
Admit: 2020-09-23 | Discharge: 2020-09-23 | Payer: Private Health Insurance - Indemnity | Primary: Student in an Organized Health Care Education/Training Program

## 2020-09-23 DIAGNOSIS — T7840XA Allergy, unspecified, initial encounter: Secondary | ICD-10-CM

## 2020-09-23 DIAGNOSIS — Z9109 Other allergy status, other than to drugs and biological substances: Secondary | ICD-10-CM

## 2020-09-28 ENCOUNTER — Encounter
Admit: 2020-09-28 | Discharge: 2020-09-28 | Payer: Private Health Insurance - Indemnity | Primary: Student in an Organized Health Care Education/Training Program

## 2020-11-05 ENCOUNTER — Encounter
Admit: 2020-11-05 | Discharge: 2020-11-05 | Payer: Private Health Insurance - Indemnity | Primary: Student in an Organized Health Care Education/Training Program

## 2020-12-03 ENCOUNTER — Encounter: Admit: 2020-12-03 | Discharge: 2020-12-03 | Payer: Private Health Insurance - Indemnity

## 2021-01-10 ENCOUNTER — Encounter: Admit: 2021-01-10 | Discharge: 2021-01-10 | Payer: Private Health Insurance - Indemnity

## 2021-01-10 DIAGNOSIS — I10 Essential (primary) hypertension: Secondary | ICD-10-CM

## 2021-01-10 DIAGNOSIS — Z9109 Other allergy status, other than to drugs and biological substances: Secondary | ICD-10-CM

## 2021-01-10 DIAGNOSIS — T7840XA Allergy, unspecified, initial encounter: Secondary | ICD-10-CM

## 2021-01-10 DIAGNOSIS — E785 Hyperlipidemia, unspecified: Secondary | ICD-10-CM

## 2021-01-17 ENCOUNTER — Encounter: Admit: 2021-01-17 | Discharge: 2021-01-17 | Payer: Private Health Insurance - Indemnity

## 2021-01-18 ENCOUNTER — Encounter: Admit: 2021-01-18 | Discharge: 2021-01-18 | Payer: Private Health Insurance - Indemnity

## 2021-01-18 ENCOUNTER — Ambulatory Visit: Admit: 2021-01-18 | Discharge: 2021-01-18 | Payer: Private Health Insurance - Indemnity

## 2021-01-18 DIAGNOSIS — T7840XA Allergy, unspecified, initial encounter: Secondary | ICD-10-CM

## 2021-01-18 DIAGNOSIS — I10 Essential (primary) hypertension: Secondary | ICD-10-CM

## 2021-01-18 DIAGNOSIS — Z9109 Other allergy status, other than to drugs and biological substances: Secondary | ICD-10-CM

## 2021-01-18 DIAGNOSIS — E785 Hyperlipidemia, unspecified: Secondary | ICD-10-CM

## 2021-01-18 MED ORDER — PROPOFOL 10 MG/ML IV EMUL 50 ML (INFUSION)(AM)(OR)
INTRAVENOUS | 0 refills | Status: DC
Start: 2021-01-18 — End: 2021-01-18

## 2021-01-19 ENCOUNTER — Encounter: Admit: 2021-01-19 | Discharge: 2021-01-19 | Payer: Private Health Insurance - Indemnity

## 2021-01-19 DIAGNOSIS — T7840XA Allergy, unspecified, initial encounter: Secondary | ICD-10-CM

## 2021-01-19 DIAGNOSIS — E785 Hyperlipidemia, unspecified: Secondary | ICD-10-CM

## 2021-01-19 DIAGNOSIS — Z9109 Other allergy status, other than to drugs and biological substances: Secondary | ICD-10-CM

## 2021-01-19 DIAGNOSIS — I10 Essential (primary) hypertension: Secondary | ICD-10-CM

## 2021-02-24 ENCOUNTER — Encounter: Admit: 2021-02-24 | Discharge: 2021-02-24 | Payer: Private Health Insurance - Indemnity

## 2021-04-06 ENCOUNTER — Encounter: Admit: 2021-04-06 | Discharge: 2021-04-06 | Payer: Private Health Insurance - Indemnity

## 2021-04-07 ENCOUNTER — Ambulatory Visit: Admit: 2021-04-07 | Discharge: 2021-04-07

## 2021-04-07 ENCOUNTER — Encounter: Admit: 2021-04-07 | Discharge: 2021-04-07

## 2021-04-07 ENCOUNTER — Encounter: Admit: 2021-04-07 | Discharge: 2021-04-07 | Payer: Private Health Insurance - Indemnity

## 2021-04-07 DIAGNOSIS — Z0142 Encounter for cervical smear to confirm findings of recent normal smear following initial abnormal smear: Secondary | ICD-10-CM

## 2021-04-07 DIAGNOSIS — Z9109 Other allergy status, other than to drugs and biological substances: Secondary | ICD-10-CM

## 2021-04-07 DIAGNOSIS — Z23 Encounter for immunization: Secondary | ICD-10-CM

## 2021-04-07 DIAGNOSIS — E785 Hyperlipidemia, unspecified: Secondary | ICD-10-CM

## 2021-04-07 DIAGNOSIS — I10 Essential (primary) hypertension: Secondary | ICD-10-CM

## 2021-04-07 DIAGNOSIS — Z0289 Encounter for other administrative examinations: Secondary | ICD-10-CM

## 2021-04-07 DIAGNOSIS — T7840XA Allergy, unspecified, initial encounter: Secondary | ICD-10-CM

## 2021-04-07 LAB — THIN PREP HOLD LABEL

## 2021-04-07 NOTE — Progress Notes
Date of Service: 04/07/2021    Subjective:             Amy Webb is a 50 y.o. female presenting for pap smear and work forms.    History of Present Illness  Recently started new job at a daycare. Has been getting social work assistance which has provided relief for her. Mood is doing well.     Thinks she may be approaching menopause as her periods are becoming more irregular and she has been having hot flashes. She describes her symptoms as more annoying than debilitating. Her LMP was 10/20.         Medical History:   Diagnosis Date   ? Allergy    ? Environmental allergies    ? Hyperlipidemia    ? Hypertension      Surgical History:   Procedure Laterality Date   ? HX HAND SURGERY  2005    Right hand   ? COLONOSCOPY DIAGNOSTIC WITH SPECIMEN COLLECTION BY BRUSHING/ WASHING - FLEXIBLE N/A 01/18/2021    Performed by Everardo All, MD at [facility] [place]   ? FRACTURE SURGERY     ? HX TUBAL LIGATION       Family History   Problem Relation Age of Onset   ? Cancer-Breast Mother    ? Diabetes Mother    ? Hypertension Mother    ? Arthritis Mother    ? Asthma Mother    ? Hearing Loss Mother    ? Hypertension Father    ? Arthritis Father    ? Other Brother         Parkinson's   ? Depression Brother         Pt stated he's suicidal; receiving help. Pt unsure of ailment, though.   ? Drug Abuse Brother    ? Mental Illness Brother    ? Cervical Cancer Neg Hx    ? Cancer-Colon Neg Hx    ? Cancer-Ovarian Neg Hx    ? Cancer-Uterine Neg Hx    ? Anesthetic Complication Neg Hx    ? Bleeding Disorders Neg Hx    ? Heart Disease Neg Hx      Social History     Socioeconomic History   ? Marital status: Single     Spouse name: Not given   ? Number of children: 1   Occupational History   ? Occupation: Chief Financial Officer: [employer]   Tobacco Use   ? Smoking status: Never Smoker   ? Smokeless tobacco: Never Used   Vaping Use   ? Vaping Use: Never used   Substance and Sexual Activity   ? Alcohol use: Not Currently     Alcohol/week: 0.0 standard drinks   ? Drug use: Never   ? Sexual activity: Yes     Partners: Male     Birth control/protection: Surgical         Objective:         ? lisinopriL (ZESTRIL) 20 mg tablet Take one tablet by mouth daily.   ? mv,calcium,min/iron/folic/vitK (ONE-A-DAY WOMEN'S COMPLETE PO) Take  by mouth.       Vitals:    04/07/21 1309 04/07/21 1352   BP: (!) 146/93 (!) 148/92   Pulse: 67    Resp: 14    SpO2: 100%    Weight: 88.1 kg (194 lb 3.2 oz)    Height: 172.7 cm (5' 8)      Body mass index is 29.53  kg/m?.     Physical Exam  Physical Exam  Vitals and nursing note reviewed. Exam conducted with a chaperone present.   Constitutional:       General: She is not in acute distress.     Appearance: Normal appearance.   HENT:      Head: Normocephalic and atraumatic.      Right Ear: External ear normal.      Left Ear: External ear normal.   Eyes:      Extraocular Movements: Extraocular movements intact.   Cardiovascular:      Rate and Rhythm: Normal rate and regular rhythm.      Heart sounds: Normal heart sounds.   Pulmonary:      Effort: Pulmonary effort is normal.      Breath sounds: Normal breath sounds.   Abdominal:      General: Abdomen is flat.      Palpations: Abdomen is soft.      Tenderness: There is no abdominal tenderness.   Genitourinary:     General: Normal vulva.      Exam position: Lithotomy position.      Vagina: Normal. No tenderness or lesions.      Cervix: Discharge (physiologic) and cervical bleeding (small amount after sample collection) present.      Uterus: Normal. Not tender.       Adnexa: Right adnexa normal and left adnexa normal.        Right: No tenderness.          Left: No tenderness.     Musculoskeletal:         General: Normal range of motion.      Cervical back: Normal range of motion.   Skin:     General: Skin is warm and dry.   Neurological:      General: No focal deficit present.      Mental Status: She is alert.      Comments: Answering questions appropriately   Psychiatric:         Mood and Affect: Mood normal.         Behavior: Behavior normal.         Assessment and Plan:  [name] was seen today for follow up and gyn exam.    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, NILM  > most recent 2021 HPV neg, normal cytology  > Based on ASCCP guidelines, she is due for a repeat  -     CYTOLOGY PAP SMEAR SCREENING; Future; Expected date: 04/07/2021  -     THIN PREP HOLD LABEL; Future; Expected date: 04/07/2021    Need for viral immunization  -     FLU VACCINE =>6 MONTHS QUADRIVALENT PF  -     COVID-19 BIVALENT BOOSTER (9YR+)(PFIZER) VAC 30MCG/0.3ML    Encounter for completion of form with patient  > [facility] Form w/ TB risk assessment completed  > Does not require TB testing based on questionnaire responses      Return in about 3 months (around 07/08/2021) for bp f/u.    No future appointments.    Patient was seen and discussed with Dr. Greer Pickerel.    Scot Jun, MD  Family Medicine PGY-1

## 2021-04-07 NOTE — Progress Notes
Patient presents to clinic for COVID-19 injection. Injection given without difficulty in RD, VIS sheet and injection card given. Consent form signed. Patient monitored in clinic for 15 minutes with no signs or symptoms of adverse reaction.

## 2021-09-24 ENCOUNTER — Emergency Department: Admit: 2021-09-24 | Discharge: 2021-09-24

## 2021-09-24 ENCOUNTER — Emergency Department: Admit: 2021-09-24 | Discharge: 2021-09-25

## 2021-09-24 ENCOUNTER — Encounter: Admit: 2021-09-24 | Discharge: 2021-09-24

## 2021-09-24 DIAGNOSIS — R1084 Generalized abdominal pain: Secondary | ICD-10-CM

## 2021-09-24 LAB — URINALYSIS DIPSTICK REFLEX TO CULTURE
GLUCOSE,UA: NEGATIVE g/dL — ABNORMAL HIGH (ref 6.0–8.0)
URINE ASCORBIC ACID, UA: NEGATIVE K/UL (ref 3–12)
URINE BILE: NEGATIVE g/dL (ref 3.5–5.0)
URINE BLOOD: NEGATIVE U/L (ref 25–110)
URINE KETONE: NEGATIVE mg/dL (ref 0.3–1.2)

## 2021-09-24 LAB — COMPREHENSIVE METABOLIC PANEL
ALT: 12 U/L (ref 7–56)
AST: 15 U/L (ref 7–40)
EGFR: >60 mL/min (ref >60)

## 2021-09-24 LAB — CBC AND DIFF
ABSOLUTE BASO COUNT: 0 K/UL (ref 0–0.20)
ABSOLUTE EOS COUNT: 0.3 K/UL (ref 0–0.45)
ABSOLUTE MONO COUNT: 0.5 K/UL (ref 0–0.80)
EOSINOPHILS %: 5 % (ref 0–5)
MCHC: 34 g/dL (ref 32.0–36.0)
MDW (MONOCYTE DISTRIBUTION WIDTH): 18 (ref <20.7)
RBC COUNT: 4.5 M/UL (ref 4.0–5.0)
WBC COUNT: 5.6 K/UL (ref 4.5–11.0)

## 2021-09-24 LAB — URINALYSIS MICROSCOPIC REFLEX TO CULTURE

## 2021-09-24 LAB — LIPASE: LIPASE: 21 U/L (ref 11–82)

## 2021-09-24 MED ORDER — IOHEXOL 350 MG IODINE/ML IV SOLN
100 mL | Freq: Once | INTRAVENOUS | 0 refills | Status: CP
Start: 2021-09-24 — End: ?
  Administered 2021-09-25: 02:00:00 100 mL via INTRAVENOUS

## 2021-09-24 MED ORDER — FENTANYL CITRATE (PF) 50 MCG/ML IJ SOLN
50 ug | Freq: Once | INTRAVENOUS | 0 refills | Status: CP
Start: 2021-09-24 — End: ?
  Administered 2021-09-25: 01:00:00 50 ug via INTRAVENOUS

## 2021-09-24 MED ORDER — SODIUM CHLORIDE 0.9 % IJ SOLN
50 mL | Freq: Once | INTRAVENOUS | 0 refills | Status: CP
Start: 2021-09-24 — End: ?
  Administered 2021-09-25: 02:00:00 50 mL via INTRAVENOUS

## 2021-09-24 MED ORDER — DICYCLOMINE 10 MG PO CAP
10 mg | ORAL_CAPSULE | Freq: Four times a day (QID) | ORAL | 0 refills | Status: AC | PRN
Start: 2021-09-24 — End: ?

## 2021-09-24 MED ORDER — DICYCLOMINE 10 MG PO CAP
10 mg | Freq: Once | ORAL | 0 refills | Status: CP
Start: 2021-09-24 — End: ?
  Administered 2021-09-25: 03:00:00 10 mg via ORAL

## 2021-09-25 NOTE — ED Provider Notes
Annu Villers is a 51 y.o. female.    Chief Complaint:  Chief Complaint   Patient presents with   ? Abdominal pain     Sharp intermittent abdominal pain x45 minutes- denies N/V.       History of Present Illness:  Nanda Czap is a 51 y.o. female, with a history of HLD and HTN who presents to the emergency department for abdominal pain. Patient endorses mid abdominal pain that radiates to the lower abdomen that began at approximately 1500 this afternoon. She denies any associated symptoms such as nausea, vomiting, fevers, or chills. The pain is exacerbated with movement but is alleviated when she eats. Her last bowel movement was this morning and it was normal. Denies any past abdominal surgeries. Otherwise, the patient has no other acute medial concerns at this time.      History provided by:  Patient  Language interpreter used: No    Abdominal pain  Associated symptoms: no chest pain, no chills, no dysuria, no fever, no nausea, no shortness of breath, no sore throat and no vomiting        Review of Systems:  Review of Systems   Constitutional: Negative for chills and fever.   HENT: Negative for sore throat.    Eyes: Negative for visual disturbance.   Respiratory: Negative for shortness of breath.    Cardiovascular: Negative for chest pain.   Gastrointestinal: Positive for abdominal pain. Negative for nausea and vomiting.   Genitourinary: Negative for dysuria.   Musculoskeletal: Negative for back pain.   Skin: Negative for rash.   Neurological: Negative for headaches.       Allergies:  Aspirin, Pcn [penicillins], and Sudafed [pseudoephedrine hcl]    Past Medical History:  Medical History:   Diagnosis Date   ? Allergy    ? Environmental allergies    ? Hyperlipidemia    ? Hypertension        Past Surgical History:  Surgical History:   Procedure Laterality Date   ? HX HAND SURGERY  2005    Right hand   ? COLONOSCOPY DIAGNOSTIC WITH SPECIMEN COLLECTION BY BRUSHING/ WASHING - FLEXIBLE N/A 01/18/2021    Performed by Everardo All, MD at [facility] OR   ? FRACTURE SURGERY     ? HX TUBAL LIGATION         Pertinent medical/surgical history reviewed  Medical History:   Diagnosis Date   ? Allergy    ? Environmental allergies    ? Hyperlipidemia    ? Hypertension      Surgical History:   Procedure Laterality Date   ? HX HAND SURGERY  2005    Right hand   ? COLONOSCOPY DIAGNOSTIC WITH SPECIMEN COLLECTION BY BRUSHING/ WASHING - FLEXIBLE N/A 01/18/2021    Performed by Everardo All, MD at [facility] OR   ? FRACTURE SURGERY     ? HX TUBAL LIGATION         Social History:  Social History     Tobacco Use   ? Smoking status: Never   ? Smokeless tobacco: Never   Vaping Use   ? Vaping Use: Never used   Substance Use Topics   ? Alcohol use: Not Currently     Alcohol/week: 0.0 standard drinks   ? Drug use: Never     Social History     Substance and Sexual Activity   Drug Use Never  Family History:  Family History   Problem Relation Age of Onset   ? Cancer-Breast Mother    ? Diabetes Mother    ? Hypertension Mother    ? Arthritis Mother    ? Asthma Mother    ? Hearing Loss Mother    ? Hypertension Father    ? Arthritis Father    ? Other Brother         Parkinson's   ? Depression Brother         Pt stated he's suicidal; receiving help. Pt unsure of ailment, though.   ? Drug Abuse Brother    ? Mental Illness Brother    ? Cervical Cancer Neg Hx    ? Cancer-Colon Neg Hx    ? Cancer-Ovarian Neg Hx    ? Cancer-Uterine Neg Hx    ? Anesthetic Complication Neg Hx    ? Bleeding Disorders Neg Hx    ? Heart Disease Neg Hx        Vitals:  ED Vitals    Date and Time T BP P RR SPO2P SPO2 User   09/24/21 2100 -- 145/95 -- -- 73 -- MM   09/24/21 2044 -- -- -- -- 76 95 % MM   09/24/21 1805 36.7 ?C (98 ?F) 153/95 69 18 PER MINUTE -- 97 % MT   09/24/21 1612 36.7 ?C (98.1 ?F) 139/90 78 18 PER MINUTE -- 98 % SF          Physical Exam:  Physical Exam  Vitals and nursing note reviewed.   Constitutional:       Appearance: Normal appearance. She is normal weight.   HENT:      Head: Normocephalic and atraumatic.   Eyes:      Extraocular Movements: Extraocular movements intact.      Conjunctiva/sclera: Conjunctivae normal.   Cardiovascular:      Rate and Rhythm: Normal rate and regular rhythm.      Pulses: Normal pulses.      Heart sounds: Normal heart sounds.   Pulmonary:      Effort: Pulmonary effort is normal.      Breath sounds: Normal breath sounds.   Abdominal:      General: Bowel sounds are normal. There is no distension.      Palpations: Abdomen is soft.      Tenderness: There is abdominal tenderness in the right upper quadrant and right lower quadrant.   Musculoskeletal:         General: Normal range of motion.      Cervical back: Neck supple.   Skin:     General: Skin is warm and dry.   Neurological:      Mental Status: She is oriented to person, place, and time. Mental status is at baseline.   Psychiatric:         Mood and Affect: Mood normal.         Behavior: Behavior normal.         Laboratory Results:  Labs Reviewed   COMPREHENSIVE METABOLIC PANEL - Abnormal       Result Value Ref Range Status    Sodium 139  137 - 147 MMOL/L Final    Potassium 3.8  3.5 - 5.1 MMOL/L Final    Chloride 104  98 - 110 MMOL/L Final    Glucose 82  70 - 100 MG/DL Final    Blood Urea Nitrogen 14  7 - 25 MG/DL Final    Creatinine 1.61  0.4 -  1.00 MG/DL Final    Calcium 16.1  8.5 - 10.6 MG/DL Final    Total Protein 8.5 (*) 6.0 - 8.0 G/DL Final    Total Bilirubin 0.3  0.3 - 1.2 MG/DL Final    Albumin 4.1  3.5 - 5.0 G/DL Final    Alk Phosphatase 64  25 - 110 U/L Final    AST (SGOT) 15  7 - 40 U/L Final    CO2 28  21 - 30 MMOL/L Final    ALT (SGPT) 12  7 - 56 U/L Final    Anion Gap 7  3 - 12 Final    eGFR >60  >60 mL/min Final   CBC AND DIFF    White Blood Cells 5.6  4.5 - 11.0 K/UL Final    RBC 4.55  4.0 - 5.0 M/UL Final    Hemoglobin 14.0  12.0 - 15.0 GM/DL Final    Hematocrit 09.6  36 - 45 % Final    MCV 90.4  80 - 100 FL Final    MCH 30.8  26 - 34 PG Final    MCHC 34.1  32.0 - 36.0 G/DL Final    RDW 04.5  11 - 15 % Final    Platelet Count 216  150 - 400 K/UL Final    MPV 8.7  7 - 11 FL Final    Neutrophils 57  41 - 77 % Final    Lymphocytes 28  24 - 44 % Final    Monocytes 9  4 - 12 % Final    Eosinophils 5  0 - 5 % Final    Basophils 1  0 - 2 % Final    Absolute Neutrophil Count 3.20  1.8 - 7.0 K/UL Final    Absolute Lymph Count 1.56  1.0 - 4.8 K/UL Final    Absolute Monocyte Count 0.50  0 - 0.80 K/UL Final    Absolute Eosinophil Count 0.30  0 - 0.45 K/UL Final    Absolute Basophil Count 0.04  0 - 0.20 K/UL Final    MDW (Monocyte Distribution Width) 18.3  <20.7 Final   LIPASE    Lipase 21  11 - 82 U/L Final   URINALYSIS DIPSTICK REFLEX TO CULTURE    Color,UA YELLOW   Final    Turbidity,UA CLEAR  CLEAR-CLEAR Final    Specific Gravity-Urine 1.023  1.005 - 1.030 Final    pH,UA 5.0  5.0 - 8.0 Final    Protein,UA NEG  NEG-NEG Final    Glucose,UA NEG  NEG-NEG Final    Ketones,UA NEG  NEG-NEG Final    Bilirubin,UA NEG  NEG-NEG Final    Blood,UA NEG  NEG-NEG Final    Urobilinogen,UA NORMAL  NORM-NORMAL Final    Nitrite,UA NEG  NEG-NEG Final    Leukocytes,UA NEG  NEG-NEG Final    Urine Ascorbic Acid, UA NEG  NEG-NEG Final   URINALYSIS MICROSCOPIC REFLEX TO CULTURE    WBCs,UA 0-2  0 - 2 /HPF Final    RBCs,UA 0-2  0 - 3 /HPF Final    Comment,UA     Final    Value: Criteria for reflex to culture are WBC>10, Positive Nitrite, and/or >=+1   leukocytes. If quantity is not sufficient, an addendum will follow.      MucousUA TRACE   Final    Squamous Epithelial Cells 5-10  0 - 5 Final   UA GREY TOP TUBE   CLEAR TOP EXTRA URINE TUBE  Urine Pregnancy  Urine Pregnancy: Negative  QC: Acceptable  Urine Pregnancy Lot #: [id number]    Radiology Interpretation:    CT ABD/PELV W CONTRAST   Final Result         1.  No bowel obstruction, inflammatory mass, ascites, or free air. Normal appendix.      2.  Probable mild diffuse hepatic steatosis.             By my electronic signature, I attest that I have personally reviewed the images for this examination and formulated the interpretations and opinions expressed in this report          Finalized by Jason Coop, M.D. on 09/24/2021 9:47 PM. Dictated by [place] Soutas, D.O. on 09/24/2021 9:25 PM.               EKG:      Medical Decision Making:  Kuuipo Anzaldo is a 51 y.o. female who presents with chief complaint as listed above. Based on the history and presentation, the list of differential diagnoses considered included, but was not limited to, abdominal pain.    ED Course  Patient examined by Rollen Sox ARNP, and Dr. Eliot Ford.  She started to have right sided abdominal pain this morning.    Labs, no leukocytosis, no other significant abnormalities.  UA sent, no UTI.    Fentanyl given for discomfort, patient states that pain is better.    Due to abdominal pain, cat scan ordered. Radiology read reveals no abnormalities.    Bentyl given for abdominal discomfort.  Prescription to pharmacy for Bentyl.  Discharge instructions to patient.         Complexity of Problems Addressed  Patient's active diagnoses as well as contributing pre-existing medical problems include:  Clinical Impression   Generalized abdominal pain         Additional data reviewed:    ? History was obtained from an independent historian: Not in addition to what is mentioned above  ? Prior non-ED notes reviewed: Not in addition to what is mentioned above  ? Independent interpretation of diagnostic tests was performed by me: Not in addition to what is mentioned above  ? Patient presentation/management was discussed with the following qualified health care professionals and/or other relevant professionals: Not in addition to what is mentioned above    Risk evaluation:    ? Diagnosis or treatment of patient condition impacted by social determinant of health: None  ? Tests Considered but not performed due to clinical scoring (if not mentioned in ED course, aside from what is implied by clinical scores listed):  ? Rationale regarding whether admission or escalation of care considered if not performed (if not mentioned in ED course, aside from what is implied by clinical scores listed):    ED Scoring:                                Facility Administered Meds:  Medications   dicyclomine (BENTYL) capsule 10 mg (has no administration in time range)   fentaNYL citrate PF (SUBLIMAZE) injection 50 mcg (50 mcg Intravenous Given 09/24/21 2020)   iohexoL (OMNIPAQUE-350) 350 mg/mL injection 100 mL (100 mL Intravenous Given 09/24/21 2122)   sodium chloride PF 0.9% injection 50 mL (50 mL Intravenous Given 09/24/21 2122)       Clinical Impression:  Clinical Impression   Generalized abdominal pain       Disposition/Follow up  ED Disposition  ED Disposition   Discharge           Dorian Heckle, MD  [address]  [place] [address]  [phone]      if you have increased pain, vomiting, return to ER.  may make an appointment with your PCP for evaluation.      Medications:  New Prescriptions    DICYCLOMINE (BENTYL) 10 MG CAPSULE    Take one capsule by mouth four times daily as needed. As needed for abdominal cramping.       Procedure Notes:  Procedures       Attestation / Supervision:  Reginold Agent, am scribing for and in the presence of Rollen Sox, APRN.    Dolores Hoose    Attestation / Supervision Note concerning Corlene Wisecarver: Docia Furl, APRN-NP, personally performed the services described in this documentation as scribed in my presence and it is both accurate and complete.    Linton Flemings, APRN-NP

## 2021-09-25 NOTE — ED Notes
This RN provides discharge teaching and instructions. Pt AAOx4, verbalizes understanding and denies further questions. Pt ambulates off unit with an even, steady gait in no acute distress. All belongings and paperwork in pts possession upon departure.

## 2021-09-27 ENCOUNTER — Encounter: Admit: 2021-09-27 | Discharge: 2021-09-27

## 2021-09-27 NOTE — Telephone Encounter
Noted; will follow up as needed.

## 2021-09-27 NOTE — Telephone Encounter
ED Discharge Follow Up  Reached patient: No  Admission Information  Hospital Name : [facility]  ED Admission Date: 09/24/21 ED Discharge Date: 09/24/21 Admission Diagnosis: Pain, abd  Discharge Diagnosis Generalized abdominal pain  Hospital Services: Unplanned  Today's call is 3 (calendar) days post discharge    Medication Reconciliation  Changes to pre-ED visit medications? No  Were new prescriptions filled?N/A  Meds reviewed and reconciled? Yes   dicyclomine (BENTYL) 10 mg capsule Take one capsule by mouth four times daily as needed. As needed for abdominal cramping.    lisinopriL (ZESTRIL) 20 mg tablet Take one tablet by mouth daily.    mv,calcium,min/iron/folic/vitK (ONE-A-DAY WOMEN'S COMPLETE PO) Take  by mouth.       Scheduling Follow-up Appointment  Upcoming appointment date and time and with whom scheduled: No future appointments.  When was patients last PCP visit: 04/07/2021  PCP primary location: [facility]  PCP appointment scheduled? No, no future appt  Specialist appointment scheduled? No  Both PCP and Specialist appointment scheduled: No  Is assistance with transportation needed? No  MyChart message sent? Active in MyChart. MyChart message sent.    ED Communication   Did Pt call Clinic prior to going to ED? No      Lavell Islam

## 2022-01-11 ENCOUNTER — Encounter: Admit: 2022-01-11 | Discharge: 2022-01-11 | Payer: No Typology Code available for payment source

## 2022-03-15 ENCOUNTER — Encounter: Admit: 2022-03-15 | Discharge: 2022-03-15 | Payer: No Typology Code available for payment source

## 2022-10-11 ENCOUNTER — Encounter: Admit: 2022-10-11 | Discharge: 2022-10-11 | Payer: No Typology Code available for payment source

## 2022-11-06 ENCOUNTER — Encounter: Admit: 2022-11-06 | Discharge: 2022-11-06 | Payer: No Typology Code available for payment source

## 2022-11-06 DIAGNOSIS — Z1231 Encounter for screening mammogram for malignant neoplasm of breast: Secondary | ICD-10-CM

## 2022-12-20 ENCOUNTER — Ambulatory Visit: Admit: 2022-12-20 | Discharge: 2022-12-20

## 2022-12-20 ENCOUNTER — Encounter: Admit: 2022-12-20 | Discharge: 2022-12-20

## 2022-12-20 ENCOUNTER — Ambulatory Visit: Admit: 2022-12-20 | Discharge: 2022-12-21

## 2022-12-20 DIAGNOSIS — Z0000 Encounter for general adult medical examination without abnormal findings: Secondary | ICD-10-CM

## 2022-12-20 DIAGNOSIS — Z9109 Other allergy status, other than to drugs and biological substances: Secondary | ICD-10-CM

## 2022-12-20 DIAGNOSIS — E785 Hyperlipidemia, unspecified: Secondary | ICD-10-CM

## 2022-12-20 DIAGNOSIS — I10 Essential (primary) hypertension: Secondary | ICD-10-CM

## 2022-12-20 DIAGNOSIS — T7840XA Allergy, unspecified, initial encounter: Secondary | ICD-10-CM

## 2022-12-20 LAB — LIPID PROFILE
CHOLESTEROL: 222 mg/dL — ABNORMAL HIGH (ref <200)
TRIGLYCERIDES: 105 mg/dL (ref <150)

## 2022-12-20 LAB — BASIC METABOLIC PANEL
ANION GAP: 8 (ref 3–12)
BLD UREA NITROGEN: 13 mg/dL (ref 7–25)
CALCIUM: 9.5 mg/dL (ref 8.5–10.6)
CHLORIDE: 104 MMOL/L (ref 98–110)
CO2: 29 MMOL/L (ref 21–30)
CREATININE: 0.7 mg/dL (ref 0.4–1.00)
EGFR: >60 mL/min (ref >60)
GLUCOSE,PANEL: 78 mg/dL (ref 70–100)
POTASSIUM: 4.1 MMOL/L — ABNORMAL HIGH (ref <100)
SODIUM: 141 MMOL/L (ref >40)

## 2022-12-20 LAB — HEMOGLOBIN A1C: HEMOGLOBIN A1C: 5.6 % (ref 4.0–5.7)

## 2022-12-20 MED ORDER — LISINOPRIL 20 MG PO TAB
20 mg | ORAL_TABLET | Freq: Every day | ORAL | 3 refills | Status: AC
Start: 2022-12-20 — End: ?

## 2022-12-20 NOTE — Patient Instructions
Please send a MyChart message or call the clinic at [phone] if you have any questions.     Thanks!    Dr. Maddalyn Lutze

## 2022-12-21 DIAGNOSIS — I10 Essential (primary) hypertension: Secondary | ICD-10-CM

## 2023-01-22 ENCOUNTER — Encounter: Admit: 2023-01-22 | Discharge: 2023-01-22 | Payer: No Typology Code available for payment source

## 2023-01-22 ENCOUNTER — Ambulatory Visit: Admit: 2023-01-22 | Discharge: 2023-01-22 | Payer: No Typology Code available for payment source

## 2023-01-22 VITALS — BP 137/86 | HR 112 | Temp 98.40000°F | Resp 18 | Ht 67.0 in | Wt 192.0 lb

## 2023-01-22 DIAGNOSIS — J309 Allergic rhinitis, unspecified: Secondary | ICD-10-CM

## 2023-01-22 DIAGNOSIS — E785 Hyperlipidemia, unspecified: Secondary | ICD-10-CM

## 2023-01-22 DIAGNOSIS — Z9109 Other allergy status, other than to drugs and biological substances: Secondary | ICD-10-CM

## 2023-01-22 DIAGNOSIS — T7840XA Allergy, unspecified, initial encounter: Secondary | ICD-10-CM

## 2023-01-22 DIAGNOSIS — I10 Essential (primary) hypertension: Secondary | ICD-10-CM

## 2023-01-22 MED ORDER — DEXTROMETHORPHAN POLISTIREX 30 MG/5 ML PO SU12
60 mg | Freq: Two times a day (BID) | ORAL | 0 refills | Status: CN
Start: 2023-01-22 — End: ?

## 2023-01-22 MED ORDER — LISINOPRIL 30 MG PO TAB
30 mg | ORAL_TABLET | Freq: Every day | ORAL | 3 refills | Status: AC
Start: 2023-01-22 — End: ?

## 2023-01-22 MED ORDER — DEXTROMETHORPHAN POLISTIREX 30 MG/5 ML PO SU12
60 mg | Freq: Two times a day (BID) | ORAL | 0 refills | 18.50000 days | Status: AC
Start: 2023-01-22 — End: ?

## 2023-01-22 NOTE — Progress Notes
Date of Service: 01/22/2023    Subjective:             Amy Webb is a 52 y.o. female; has  has a past medical history of Allergy, Environmental allergies, Hyperlipidemia, and Hypertension.    Chief Complaint   Patient presents with    Blood Pressure Check         Patient Reported Other  What topic(s) would you like to cover during your appointment?:  Follow up  Please describe the issue(s) and history with the issue (location, severity, duration, symptoms, etc.).:  None  What has been done so far to take care of the issue(s)?:  Not applicable  What are your goals for this visit?:  To get the right medicine         BP f/u  -taking medication as prescribed  -denies SE  -measures daily at home and SBP usually in 130s  -denies current chest pain, palpitations, dizziness, consuming caffeine prior to appt  BP Readings from Last 15 Encounters:   01/22/23 137/86   12/20/22 (!) 153/98   09/24/21 (!) 145/95   04/07/21 (!) 148/92   01/18/21 (!) 149/98   09/21/20 135/88   10/06/19 133/86   08/28/19 (!) 141/98   08/14/19 (!) 146/97   07/15/19 (!) 163/110   07/07/19 (!) 142/102   06/30/19 (!) 168/120   09/22/16 143/89   06/23/16 (!) 154/101   07/14/15 (!) 126/97     Pulse Readings from Last 15 Encounters:   01/22/23 112   12/20/22 66   09/24/21 69   04/07/21 67   01/18/21 76   09/21/20 71   10/06/19 74   08/28/19 79   08/14/19 80   07/07/19 81   06/30/19 79   09/22/16 96   07/14/15 70   04/06/14 91   03/26/13 70       Wt Readings from Last 15 Encounters:   01/22/23 87.1 kg (192 lb)   12/20/22 88.7 kg (195 lb 9.6 oz)   09/24/21 70.3 kg (155 lb)   04/07/21 88.1 kg (194 lb 3.2 oz)   01/18/21 86 kg (189 lb 9.5 oz)   09/21/20 89.8 kg (198 lb)   10/06/19 88 kg (194 lb)   08/28/19 89.4 kg (197 lb)   08/14/19 89.8 kg (198 lb)   07/15/19 89.4 kg (197 lb)   07/07/19 74.8 kg (165 lb)   06/30/19 70.3 kg (155 lb)   09/21/16 65.8 kg (145 lb)   06/23/16 78.9 kg (174 lb)   07/14/15 79.1 kg (174 lb 6.4 oz)      Allergy sx  -for past 3 days congestion and intermittent frontal headache  -runny nose  -says she has allergies intermittently all year but doesn't take anything for it and doesn't want to use anything that goes into her nose  -no recent sick contacts     Patient Active Problem List    Diagnosis Date Noted    Other chest pain 07/07/2019    Essential hypertension 04/08/2014    History of abnormal cervical Pap smear 04/08/2014      Surgical History:   Procedure Laterality Date    HX HAND SURGERY  2005    Right hand    COLONOSCOPY DIAGNOSTIC WITH SPECIMEN COLLECTION BY BRUSHING/ WASHING - FLEXIBLE N/A 01/18/2021    Performed by Everardo All, MD at [id number] OR    FRACTURE SURGERY      HX TUBAL LIGATION  Review of Systems  As noted above    Objective:          lisinopriL (ZESTRIL) 20 mg tablet Take one tablet by mouth daily.     Vitals:    01/22/23 1548   BP: 137/86   BP Source: Arm, Left Upper   Pulse: 112   Temp: 36.9 ?C (98.4 ?F)   Resp: 18   SpO2: 97%   TempSrc: Skin   PainSc: Four   Weight: 87.1 kg (192 lb)   Height: 170.2 cm (5' 7)     Body mass index is 30.07 kg/m?Marland Kitchen     Physical Exam  Vitals and nursing note reviewed.   Constitutional:       Appearance: Normal appearance.   HENT:      Head: Atraumatic.      Comments: Frontal sinus TTP     Nose: Congestion and rhinorrhea present.      Mouth/Throat:      Mouth: Mucous membranes are moist.      Pharynx: Oropharynx is clear.   Eyes:      Conjunctiva/sclera: Conjunctivae normal.   Cardiovascular:      Rate and Rhythm: Regular rhythm. Tachycardia present.      Pulses: Normal pulses.      Heart sounds: Normal heart sounds.   Pulmonary:      Effort: No respiratory distress.      Breath sounds: Normal breath sounds.   Neurological:      General: No focal deficit present.      Mental Status: She is alert.   Psychiatric:         Behavior: Behavior normal.              Assessment and Plan:  [name] was seen today for blood pressure check.    Diagnoses and all orders for this visit:    Essential hypertension  -Not at goal of SBP <130  -Discussed importance of dietary changes, weight loss to help manage HTN  -Since not at goal at home either, will increase lisinopril dosage and f/u in 2 months  -If BP not at goal, recommend adding HCTZ  -     lisinopriL (ZESTRIL) 30 mg tablet; Take one tablet by mouth daily.    Allergic rhinitis, unspecified seasonality, unspecified trigger  -Since pateint doesn't want anyhting that goes into her nose eg flonase, recommend trying 24 hr non-drowsy antihistamine eg claritin or Zyrtec  -D/t congestion and allergic to pseudoephedrine, presribed dextromethorphan for congestion management if antihistamine is not enough.  -Recommended APAP for sinus headache.       Other orders  -     [place] [facility] FOLLOW UP IN FAMILY MEDICINE  -     dextromethorphan polistirex ER (DELSYM) 30 mg/5 mL oral suspension; Take 10 mL by mouth every 12 hours.  -     [place] [facility] FOLLOW UP IN FAMILY MEDICINE; Future; Expected date: 03/24/2023          Patient seen and discussed with Dr.  Kelby Fam, MD  Family Medicine Resident Physician  [facility]

## 2023-01-23 DIAGNOSIS — I10 Essential (primary) hypertension: Principal | ICD-10-CM

## 2023-01-25 NOTE — Progress Notes
I performed a history and physical examination of the patient and discussed the management with the resident.  I reviewed the resident's note and agree with the documented findings and plan of care.

## 2023-01-31 ENCOUNTER — Encounter: Admit: 2023-01-31 | Discharge: 2023-01-31 | Payer: No Typology Code available for payment source

## 2023-03-12 ENCOUNTER — Emergency Department: Admit: 2023-03-12 | Discharge: 2023-03-12 | Payer: No Typology Code available for payment source

## 2023-03-12 ENCOUNTER — Encounter: Admit: 2023-03-12 | Discharge: 2023-03-12 | Payer: No Typology Code available for payment source

## 2023-03-12 MED ORDER — METHOCARBAMOL 750 MG PO TAB
750 mg | Freq: Once | ORAL | 0 refills | Status: CP
Start: 2023-03-12 — End: ?
  Administered 2023-03-13: 01:00:00 750 mg via ORAL

## 2023-03-12 MED ORDER — ACETAMINOPHEN 500 MG PO TAB
500 mg | Freq: Once | ORAL | 0 refills | Status: CP
Start: 2023-03-12 — End: ?
  Administered 2023-03-13: 01:00:00 500 mg via ORAL
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
## 2023-03-15 ENCOUNTER — Encounter: Admit: 2023-03-15 | Discharge: 2023-03-15 | Payer: No Typology Code available for payment source
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
## 2023-03-15 NOTE — Telephone Encounter
Noted. Will follow as needed.

## 2023-03-30 ENCOUNTER — Encounter: Admit: 2023-03-30 | Discharge: 2023-03-30 | Payer: No Typology Code available for payment source

## 2023-04-02 ENCOUNTER — Encounter: Admit: 2023-04-02 | Discharge: 2023-04-02

## 2023-04-02 ENCOUNTER — Ambulatory Visit: Admit: 2023-04-02 | Discharge: 2023-04-03

## 2023-04-02 ENCOUNTER — Ambulatory Visit: Admit: 2023-04-02 | Discharge: 2023-04-02 | Payer: No Typology Code available for payment source

## 2023-04-02 DIAGNOSIS — H1012 Acute atopic conjunctivitis, left eye: Secondary | ICD-10-CM

## 2023-04-02 DIAGNOSIS — Z23 Encounter for immunization: Secondary | ICD-10-CM

## 2023-04-02 DIAGNOSIS — I10 Essential (primary) hypertension: Secondary | ICD-10-CM

## 2023-04-02 DIAGNOSIS — M25562 Pain in left knee: Secondary | ICD-10-CM

## 2023-04-02 MED ORDER — LISINOPRIL 40 MG PO TAB
40 mg | ORAL_TABLET | Freq: Every day | ORAL | 3 refills | Status: AC
Start: 2023-04-02 — End: ?
  Filled 2023-05-21: 90d supply
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
## 2023-04-20 ENCOUNTER — Encounter: Admit: 2023-04-20 | Discharge: 2023-04-20

## 2023-05-21 ENCOUNTER — Encounter: Admit: 2023-05-21 | Discharge: 2023-05-21

## 2023-05-21 ENCOUNTER — Ambulatory Visit: Admit: 2023-05-21 | Discharge: 2023-05-21

## 2023-05-21 MED FILL — ATORVASTATIN 20 MG PO TAB: 20 mg | ORAL | 45 days supply | Status: CN

## 2023-05-24 ENCOUNTER — Encounter: Admit: 2023-05-24 | Discharge: 2023-05-24

## 2023-05-31 ENCOUNTER — Encounter: Admit: 2023-05-31 | Discharge: 2023-05-31

## 2023-06-09 ENCOUNTER — Encounter: Admit: 2023-06-09 | Discharge: 2023-06-09

## 2023-06-09 MED FILL — LISINOPRIL 40 MG PO TAB: 40 mg | ORAL | 90 days supply | Qty: 90 | Fill #1 | Status: CP

## 2023-06-09 MED FILL — ATORVASTATIN 20 MG PO TAB: 20 mg | ORAL | 45 days supply | Qty: 90 | Fill #1 | Status: CP

## 2023-06-20 ENCOUNTER — Encounter: Admit: 2023-06-20 | Discharge: 2023-06-20

## 2023-07-10 ENCOUNTER — Ambulatory Visit: Admit: 2023-07-10 | Discharge: 2023-07-10

## 2023-07-10 ENCOUNTER — Encounter: Admit: 2023-07-10 | Discharge: 2023-07-10

## 2023-07-10 ENCOUNTER — Ambulatory Visit: Admit: 2023-07-10 | Discharge: 2023-07-11

## 2023-07-11 ENCOUNTER — Encounter: Admit: 2023-07-11 | Discharge: 2023-07-11

## 2023-09-07 ENCOUNTER — Encounter: Admit: 2023-09-07 | Discharge: 2023-09-07

## 2023-09-14 ENCOUNTER — Encounter: Admit: 2023-09-14 | Discharge: 2023-09-14

## 2023-09-22 ENCOUNTER — Ambulatory Visit: Admit: 2023-09-22 | Discharge: 2023-09-22 | Payer: PRIVATE HEALTH INSURANCE

## 2023-09-22 ENCOUNTER — Encounter: Admit: 2023-09-22 | Discharge: 2023-09-22 | Payer: PRIVATE HEALTH INSURANCE

## 2023-09-24 ENCOUNTER — Encounter: Admit: 2023-09-24 | Discharge: 2023-09-24 | Payer: PRIVATE HEALTH INSURANCE

## 2023-09-28 ENCOUNTER — Encounter: Admit: 2023-09-28 | Discharge: 2023-09-28 | Payer: PRIVATE HEALTH INSURANCE

## 2023-09-28 ENCOUNTER — Ambulatory Visit: Admit: 2023-09-28 | Discharge: 2023-09-28 | Payer: PRIVATE HEALTH INSURANCE

## 2023-09-28 ENCOUNTER — Ambulatory Visit: Admit: 2023-09-28 | Discharge: 2023-09-29 | Payer: PRIVATE HEALTH INSURANCE

## 2023-09-28 DIAGNOSIS — I10 Essential (primary) hypertension: Secondary | ICD-10-CM

## 2023-09-28 NOTE — Progress Notes
 Date of Service: 09/28/2023  Chief Complaint   Patient presents with    Hypertension     follow up     Hx provided by: [facility]  Accompanied: no  Interpretor used for the appt: no    SUBJECTIVE:            Amy Webb is a established 53 y.o. female who presents to [facility] for hypertension .     Seen by Dr. Annice Kim on 07/10/23 for hypertension. She was continued on lisinopril 40 mg daily. Today she reports taking the lisinopril 40 mg at night. Her step mom is a Engineer, civil (consulting) and will check when she can. No concerns with the medication but has not taken her medication today.     She has not established with PT for her knee due to transportation issues.       ROS per HPI, all other systems either not reviewed or not relevant.       OBJECTIVE:   Objective         Vitals:    09/28/23 1609 09/28/23 1612   BP: (!) 150/94 (!) 140/91   Pulse: 71 69   Temp: 36.3 ?C (97.3 ?F)    Resp: 18    SpO2: 97%    TempSrc: Temporal    PainSc: Six    Weight: 88.1 kg (194 lb 3.2 oz)    Height: 170.2 cm (5' 7.01)      Body mass index is 30.41 kg/m?Amy Webb   Physical Exam  Vitals and nursing note reviewed.   Constitutional:       General: She is not in acute distress.  HENT:      Head: Normocephalic.      Right Ear: External ear normal.      Left Ear: External ear normal.      Nose: Nose normal.   Cardiovascular:      Rate and Rhythm: Normal rate and regular rhythm.   Pulmonary:      Effort: Pulmonary effort is normal.   Neurological:      General: No focal deficit present.      Mental Status: She is alert and oriented to person, place, and time.   Psychiatric:         Mood and Affect: Mood normal.         Behavior: Behavior normal.         Thought Content: Thought content normal.         Judgment: Judgment normal.             ASSESSMENT AND PLAN:    Assessment & Plan  Primary hypertension  - Treatment goal: Systolic blood pressure < 140, diastolic BP < 90  - Home blood pressures being performed - No  BP Readings from Last 4 Encounters: 09/28/23 (!) 140/91   07/10/23 129/79   05/21/23 (!) 142/98   04/02/23 (!) 138/99     Plan:   - Discussed hypertension and reviewed goals.  - Will plan to continue lisinopril 40 mg daily but concerned that she is not being adequately treated over a full 24 hours and may need to add on a second agent such as amlodipine 5 mg daily.  Discussed that she should take her blood pressures daily and follow-up in clinic in 1 month.    Orders:    [facility] AMB FOLLOW UP IN FAMILY MEDICINE    BASIC METABOLIC PANEL; Future      Assessment and plan discussed in detail  with patient, she was amenable to this plan.       Patient was discussed with Carnella Christians, MD, MPH, MHSA, FAAFP    Juline Ohara, DO  Resident Physician, PGY-3  Department of Family Medicine  [facility] Number: [phone]

## 2023-12-04 ENCOUNTER — Encounter: Admit: 2023-12-04 | Discharge: 2023-12-04 | Payer: PRIVATE HEALTH INSURANCE

## 2024-03-25 ENCOUNTER — Encounter: Admit: 2024-03-25 | Discharge: 2024-03-25 | Payer: PRIVATE HEALTH INSURANCE

## 2024-04-22 ENCOUNTER — Ambulatory Visit: Admit: 2024-04-22 | Discharge: 2024-04-23 | Payer: PRIVATE HEALTH INSURANCE

## 2024-04-22 ENCOUNTER — Encounter: Admit: 2024-04-22 | Discharge: 2024-04-22 | Payer: PRIVATE HEALTH INSURANCE

## 2024-04-24 ENCOUNTER — Encounter: Admit: 2024-04-24 | Discharge: 2024-04-24 | Payer: PRIVATE HEALTH INSURANCE

## 2024-04-24 ENCOUNTER — Ambulatory Visit: Admit: 2024-04-24 | Discharge: 2024-04-24 | Payer: PRIVATE HEALTH INSURANCE

## 2024-04-26 ENCOUNTER — Encounter: Admit: 2024-04-26 | Discharge: 2024-04-26 | Payer: PRIVATE HEALTH INSURANCE

## 2024-05-21 ENCOUNTER — Encounter: Admit: 2024-05-21 | Discharge: 2024-05-21 | Payer: PRIVATE HEALTH INSURANCE

## 2024-05-21 NOTE — Research [[id number]]
 Research Informed Consent Note    NAME: Amy Webb             MRN: [id number]             DOB:1971-01-20          AGE: 53 y.o.    IRB Number: [id number]  Consent Approval Dates: 01/14/2024-01/12/2025    Clinical research participation and research nature of the trial were discussed with subject. The subject was alert and oriented during consent discussion and was unaccompanied. Subject was informed that study is voluntary and  she may withdraw consent at any time for any reason by notifying study team.  Study purpose, procedures, benefits, risks and duration of the study, confidentiality information, and compensation were discussed.  Alternatives to participation were discussed per consent form.  Subject verbalized understanding.    Subject was provided time to review the consent form.  All questions asked were answered.  Subject voiced desire to participate in the study and signed the informed consent form without coercion and undue influence.  A copy of the signed consent was given to the subject as well as contact information for the study team.  A copy of the consent form was e-mailed to [facility] Information Management ([facility]) for scanning into the subject's medical record.    No research procedures took place prior to consenting.

## 2024-06-09 ENCOUNTER — Encounter: Admit: 2024-06-09 | Discharge: 2024-06-09 | Payer: PRIVATE HEALTH INSURANCE

## 2024-06-09 DIAGNOSIS — R232 Flushing: Principal | ICD-10-CM

## 2024-06-09 MED ORDER — ESTRADIOL-NORETHINDRONE ACET 0.05-0.14 MG/24 HR TD PTSW
1 | MEDICATED_PATCH | TRANSDERMAL | 1 refills | 84.00000 days | Status: AC
Start: 2024-06-09 — End: ?

## 2024-06-09 NOTE — Telephone Encounter [36]
 Fax received from pharmacy. Refill sent to pharmacy, via escribe per protocol.

## 2024-06-17 ENCOUNTER — Encounter: Admit: 2024-06-17 | Discharge: 2024-06-17 | Payer: PRIVATE HEALTH INSURANCE

## 2024-07-07 ENCOUNTER — Emergency Department
Admit: 2024-07-07 | Discharge: 2024-07-08 | Discharge status: Against Medical Advice | Payer: PRIVATE HEALTH INSURANCE | Attending: Family

## 2024-07-07 ENCOUNTER — Encounter: Admit: 2024-07-07 | Discharge: 2024-07-07 | Payer: PRIVATE HEALTH INSURANCE

## 2024-07-08 ENCOUNTER — Encounter: Admit: 2024-07-08 | Discharge: 2024-07-08 | Payer: PRIVATE HEALTH INSURANCE

## 2024-07-08 NOTE — Telephone Encounter [36]
 ED Discharge Follow Up  Reached patient: NoMy Chart message sent per protocol   Patient Date of Birth: [birth year]  Admission Information  Hospital Name : [facility]  ED Admission Date: 07/07/24   ED Discharge Date: 07/07/24   Admission Diagnosis: Mouth Pain   Discharge Diagnosis: [facility] Services: Unplanned  Today's call is 1 (calendar) days post discharge    Medication Reconciliation  Changes to pre-ED visit medications? No  Were new prescriptions filled? N/A  Meds reviewed and reconciled? Yes    Current Outpatient Medications   Medication Instructions    atorvastatin  (LIPITOR) 40 mg, Oral, DAILY    cetirizine (ZYRTEC) 10 mg, DAILY  PRN    estradiol -norethindrone  acet (COMBIPATCH ) 0.05/0.14 mg/24 hr patch 1 patch, Transdermal, TWO TIMES WEEKLY    lisinopril  (ZESTRIL ) 40 mg, Oral, DAILY    Scheduling Follow-up Appointment  Upcoming appointments:   Future Appointments   Date Time Provider Department Center   08/22/2024  4:30 PM Carolynn Cameron CROME, MD [facility] FM     When was patient?s last PCP visit: 04/22/2024  PCP primary location: [facility]  PCP appointment scheduled? Yes, Date: 08/22/24 previously scheduled    Specialist appointment scheduled? No  Is assistance with transportation needed? Unable to assess  MyChart message sent? Active in MyChart. MyChart message sent.  Artera text sent? No      ED Communication   Did patient call clinic prior to going to ED? No  Reason patient went to ED: Unable to obtain    Edsel Schmitz, RN

## 2024-07-08 NOTE — Telephone Encounter [36]
 Noted.  Will follow up as needed.
